# Patient Record
Sex: Female | Born: 2005 | Hispanic: Yes | Marital: Single | Smoking: Never smoker
Health system: Southern US, Community
[De-identification: ages and names within clinical notes are randomized; demographics above are authoritative.]

## PROBLEM LIST (undated history)

## (undated) DIAGNOSIS — S52502A Unspecified fracture of the lower end of left radius, initial encounter for closed fracture: Secondary | ICD-10-CM

## (undated) DIAGNOSIS — K5900 Constipation, unspecified: Secondary | ICD-10-CM

## (undated) HISTORY — PX: WISDOM TOOTH EXTRACTION: SHX21

## (undated) HISTORY — DX: Unspecified fracture of the lower end of left radius, initial encounter for closed fracture: S52.502A

## (undated) HISTORY — DX: Constipation, unspecified: K59.00

---
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
## 2023-03-09 DIAGNOSIS — H5213 Myopia, bilateral: Secondary | ICD-10-CM | POA: Diagnosis not present

## 2023-04-26 DIAGNOSIS — H5213 Myopia, bilateral: Secondary | ICD-10-CM | POA: Diagnosis not present

## 2023-09-12 ENCOUNTER — Ambulatory Visit (HOSPITAL_COMMUNITY)
Admission: EM | Admit: 2023-09-12 | Discharge: 2023-09-12 | Disposition: A | Discharge status: Home / Self Care | Attending: Family Medicine | Admitting: Family Medicine

## 2023-09-12 ENCOUNTER — Other Ambulatory Visit: Payer: Self-pay

## 2023-09-12 ENCOUNTER — Encounter (HOSPITAL_COMMUNITY): Payer: Self-pay | Admitting: Emergency Medicine

## 2023-09-12 DIAGNOSIS — Z202 Contact with and (suspected) exposure to infections with a predominantly sexual mode of transmission: Secondary | ICD-10-CM | POA: Insufficient documentation

## 2023-09-12 MED ORDER — DOXYCYCLINE HYCLATE 100 MG PO CAPS: 100.0000 mg | ORAL_CAPSULE | Freq: Two times a day (BID) | ORAL | 0 refills | Status: AC

## 2023-09-12 NOTE — Discharge Instructions (Addendum)
 We have sent testing for sexually transmitted infections. We will notify you of any positive results once they are received. If required, we will prescribe any medications you might need.  Please refrain from all sexual activity for at least the next seven days.

## 2023-09-12 NOTE — ED Provider Notes (Signed)
  [facility]   [id number] 09/12/23 Arrival Time: 1916  ASSESSMENT & PLAN:  1. Possible exposure to STD    Will tx empirically for chlamydia. Meds ordered this encounter  Medications   doxycycline (VIBRAMYCIN) 100 MG capsule    Sig: Take 1 capsule (100 mg total) by mouth 2 (two) times daily.    Dispense:  14 capsule    Refill:  0      Discharge Instructions      We have sent testing for sexually transmitted infections. We will notify you of any positive results once they are received. If required, we will prescribe any medications you might need.  Please refrain from all sexual activity for at least the next seven days.    Without s/s of PID.  Labs Reviewed  CERVICOVAGINAL ANCILLARY ONLY    Will notify of any positive results. Instructed to refrain from sexual activity for at least seven days.  Reviewed expectations re: course of current medical issues. Questions answered. Outlined signs and symptoms indicating need for more acute intervention. Patient verbalized understanding. After Visit Summary given.   SUBJECTIVE:  Joyce Haas is a 18 y.o. female who reports possible chlamydia exposure; recent partner tested + and informed her. She denies any symptoms.  Patient's last menstrual period was 09/08/2023.   OBJECTIVE:  Vitals:   09/12/23 1945  BP: 126/68  Pulse: 77  Resp: 18  Temp: 97.8 F (36.6 C)  TempSrc: Oral  SpO2: 100%    Gen: NAD GU: deferred Skin: warm and dry Psychological: alert and cooperative; normal mood and affect.    Labs Reviewed  CERVICOVAGINAL ANCILLARY ONLY    No Known Allergies  Past Medical History:  Diagnosis Date   Constipation    Fracture of left distal radius 07/29/2014   Followed by Dr. Aloha Arnold    Family History  Problem Relation Age of Onset   GER disease Father    Hypertension Father    Diabetes Maternal Grandmother    Hypertension Maternal Grandfather    Diabetes Paternal Grandmother     Hypertension Paternal Grandmother    Hyperlipidemia Paternal Grandmother    Diabetes Paternal Grandfather    Social History   Socioeconomic History   Marital status: Single    Spouse name: Not on file   Number of children: Not on file   Years of education: Not on file   Highest education level: Not on file  Occupational History   Not on file  Tobacco Use   Smoking status: Never   Smokeless tobacco: Never  Vaping Use   Vaping status: Some Days  Substance and Sexual Activity   Alcohol use: Never   Drug use: Never   Sexual activity: Not Currently  Other Topics Concern   Not on file  Social History Narrative   Not on file   Social Drivers of Health   Financial Resource Strain: Not on file  Food Insecurity: Not on file  Transportation Needs: Not on file  Physical Activity: Not on file  Stress: Not on file  Social Connections: Not on file  Intimate Partner Violence: Not on file           Afton Albright, MD 09/12/23 [phone]

## 2023-09-12 NOTE — ED Triage Notes (Signed)
 Denies vaginal discharge, burning or itching.  Reports partner has been diagnosed with chlamydia

## 2023-09-13 ENCOUNTER — Ambulatory Visit (HOSPITAL_COMMUNITY): Payer: Self-pay

## 2023-09-13 LAB — CERVICOVAGINAL ANCILLARY ONLY
Bacterial Vaginitis (gardnerella): POSITIVE — AB
Candida Glabrata: NEGATIVE
Candida Vaginitis: NEGATIVE
Chlamydia: POSITIVE — AB
Comment: NEGATIVE
Comment: NEGATIVE
Comment: NEGATIVE
Comment: NEGATIVE
Comment: NEGATIVE
Comment: NORMAL
Neisseria Gonorrhea: NEGATIVE
Trichomonas: NEGATIVE

## 2023-09-26 ENCOUNTER — Ambulatory Visit (HOSPITAL_COMMUNITY)

## 2023-09-26 ENCOUNTER — Ambulatory Visit (HOSPITAL_COMMUNITY)
Admission: RE | Admit: 2023-09-26 | Discharge: 2023-09-26 | Disposition: A | Discharge status: Home / Self Care | Source: Ambulatory Visit | Attending: Pediatrics | Admitting: Pediatrics

## 2023-09-26 ENCOUNTER — Encounter (HOSPITAL_COMMUNITY): Payer: Self-pay

## 2023-09-26 VITALS — BP 137/87 | HR 83 | Temp 98.1°F | Resp 16

## 2023-09-26 DIAGNOSIS — Z09 Encounter for follow-up examination after completed treatment for conditions other than malignant neoplasm: Secondary | ICD-10-CM

## 2023-09-26 NOTE — Discharge Instructions (Addendum)
 Too early for retesting today. Should wait 4 weeks from end of treatment or if symptoms occur. Should also return in 3-4 months for retesting.   Return as needed to urgent care.

## 2023-09-26 NOTE — ED Provider Notes (Signed)
 [facility]    CSN: [id number] Arrival date & time: 09/26/23  1641      History   Chief Complaint Chief Complaint  Patient presents with   SEXUALLY TRANSMITTED DISEASE    HPI Joyce Haas is a 18 y.o. female.   18 y.o. female who presents to urgent care wanting retesting for cure of chlamydia. She finished her antibiotics last week. She reports no symptoms. Her partner was under treatment as well. Denies vaginal discharge, pain, dysuria.      Past Medical History:  Diagnosis Date   Constipation    Fracture of left distal radius 07/29/2014   Followed by Dr. Camella     Patient Active Problem List   Diagnosis Date Noted   Acanthosis nigricans 10/15/2013   Allergic rhinitis 07/05/2013   Obesity, unspecified 07/05/2013    Past Surgical History:  Procedure Laterality Date   TONSILLECTOMY AND ADENOIDECTOMY  10/02/2013   Dr. Karis   WISDOM TOOTH EXTRACTION      OB History   No obstetric history on file.      Home Medications    Prior to Admission medications   Medication Sig Start Date End Date Taking? Authorizing Provider  doxycycline  (VIBRAMYCIN ) 100 MG capsule Take 1 capsule (100 mg total) by mouth 2 (two) times daily. 09/12/23   Rolinda Rogue, MD    Family History Family History  Problem Relation Age of Onset   GER disease Father    Hypertension Father    Diabetes Maternal Grandmother    Hypertension Maternal Grandfather    Diabetes Paternal Grandmother    Hypertension Paternal Grandmother    Hyperlipidemia Paternal Grandmother    Diabetes Paternal Grandfather     Social History Social History   Tobacco Use   Smoking status: Never   Smokeless tobacco: Never  Vaping Use   Vaping status: Some Days  Substance Use Topics   Alcohol use: Never   Drug use: Never     Allergies   Patient has no known allergies.   Review of Systems Review of Systems  Constitutional:  Negative for chills and fever.  HENT:  Negative for ear  pain and sore throat.   Eyes:  Negative for pain and visual disturbance.  Respiratory:  Negative for cough and shortness of breath.   Cardiovascular:  Negative for chest pain and palpitations.  Gastrointestinal:  Negative for abdominal pain and vomiting.  Genitourinary:  Negative for dysuria and hematuria.  Musculoskeletal:  Negative for arthralgias and back pain.  Skin:  Negative for color change and rash.  Neurological:  Negative for seizures and syncope.  All other systems reviewed and are negative.    Physical Exam Triage Vital Signs ED Triage Vitals  Encounter Vitals Group     BP 09/26/23 1654 137/87     Girls Systolic BP Percentile --      Girls Diastolic BP Percentile --      Boys Systolic BP Percentile --      Boys Diastolic BP Percentile --      Pulse Rate 09/26/23 1654 83     Resp 09/26/23 1654 16     Temp 09/26/23 1654 98.1 F (36.7 C)     Temp Source 09/26/23 1654 Oral     SpO2 09/26/23 1654 98 %     Weight --      Height --      Head Circumference --      Peak Flow --      Pain Score  09/26/23 1653 0     Pain Loc --      Pain Education --      Exclude from Growth Chart --    No data found.  Updated Vital Signs BP 137/87 (BP Location: Left Arm)   Pulse 83   Temp 98.1 F (36.7 C) (Oral)   Resp 16   LMP 09/08/2023 (Exact Date)   SpO2 98%   Visual Acuity Right Eye Distance:   Left Eye Distance:   Bilateral Distance:    Right Eye Near:   Left Eye Near:    Bilateral Near:     Physical Exam Vitals and nursing note reviewed.  Constitutional:      General: She is not in acute distress.    Appearance: She is well-developed.  HENT:     Head: Normocephalic and atraumatic.     Mouth/Throat:     Mouth: Mucous membranes are moist.   Eyes:     Conjunctiva/sclera: Conjunctivae normal.    Cardiovascular:     Rate and Rhythm: Normal rate and regular rhythm.     Heart sounds: No murmur heard. Pulmonary:     Effort: Pulmonary effort is normal. No  respiratory distress.     Breath sounds: Normal breath sounds.  Abdominal:     Palpations: Abdomen is soft.   Musculoskeletal:        General: No swelling.     Cervical back: Neck supple.   Skin:    General: Skin is warm and dry.     Capillary Refill: Capillary refill takes less than 2 seconds.   Neurological:     Mental Status: She is alert.   Psychiatric:        Mood and Affect: Mood normal.      UC Treatments / Results  Labs (all labs ordered are listed, but only abnormal results are displayed) Labs Reviewed - No data to display  EKG   Radiology No results found.  Procedures Procedures (including critical care time)  Medications Ordered in UC Medications - No data to display  Initial Impression / Assessment and Plan / UC Course  I have reviewed the triage vital signs and the nursing notes.  Pertinent labs & imaging results that were available during my care of the patient were reviewed by me and considered in my medical decision making (see chart for details).     Follow-up exam after treatment   Advised patient that retesting is not recommended until at least 4 weeks post-treatment. She is not having symptoms. Also advised, she should be retested in 3-4 months. Recommend return if symptoms occur.  Final Clinical Impressions(s) / UC Diagnoses   Final diagnoses:  Follow-up exam after treatment     Discharge Instructions      Too early for retesting today. Should wait 4 weeks from end of treatment or if symptoms occur. Should also return in 3-4 months for retesting.   Return as needed to urgent care.     ED Prescriptions   None    PDMP not reviewed this encounter.   Teresa Almarie LABOR, PA-C 09/26/23 1717

## 2023-09-26 NOTE — ED Triage Notes (Signed)
 Pt states she would like STI checked again she took all meds for chlamydia but only waited 6 days. She denies any sx.

## 2023-11-16 IMAGING — CT CT HEAD W/O CM
4 series · 16 of 47 positions shown, 18 images · non-contrast
Comparison: None.

CLINICAL DATA: headache 1 month without prior headache history



[Series 2: head wo · axial · 0.45mm/px · z∈[+1042,+1162]mm · 7 of 33 slices shown, 9 images]
[im 5/33  brain]
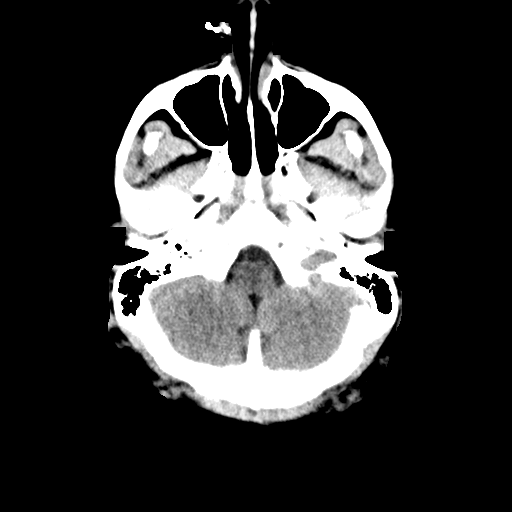
[im 5/33  bone]
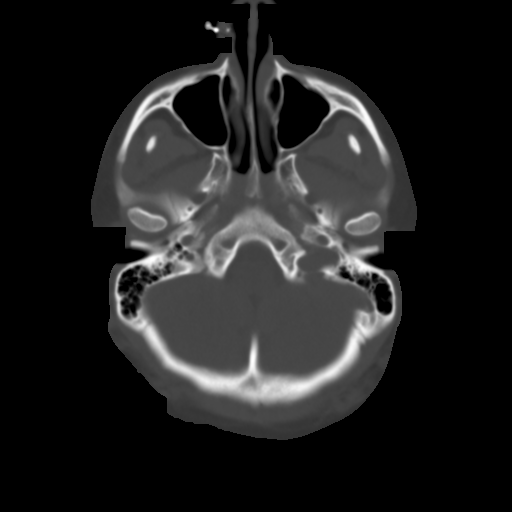
[im 9/33  brain]
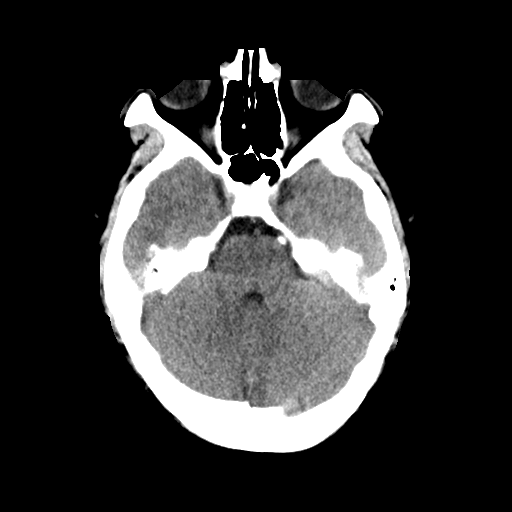
[im 13/33  brain]
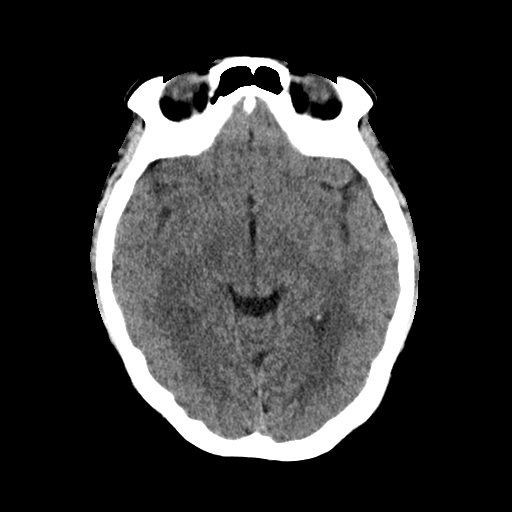
[im 17/33  brain]
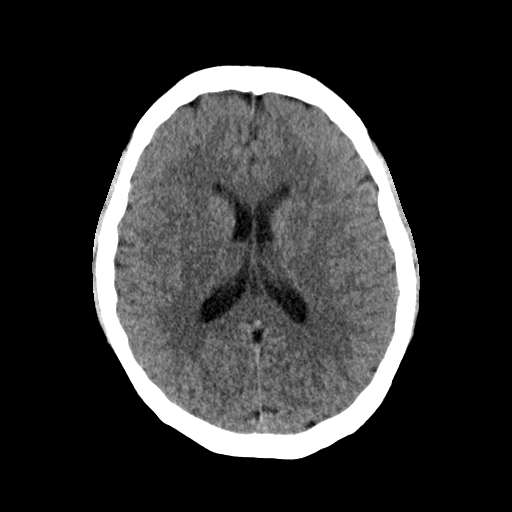
[im 21/33  brain]
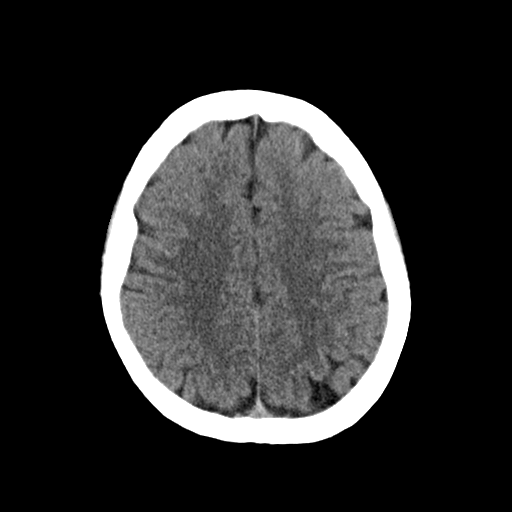
[im 21/33  bone]
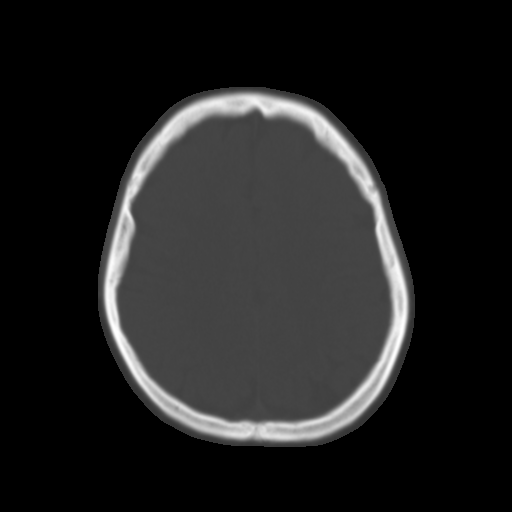
[im 25/33  brain]
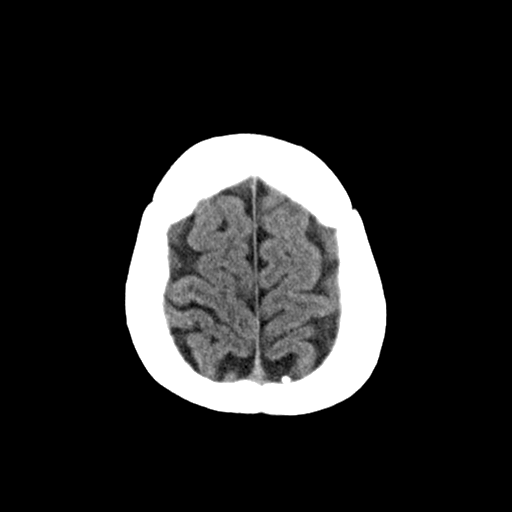
[im 29/33  brain]
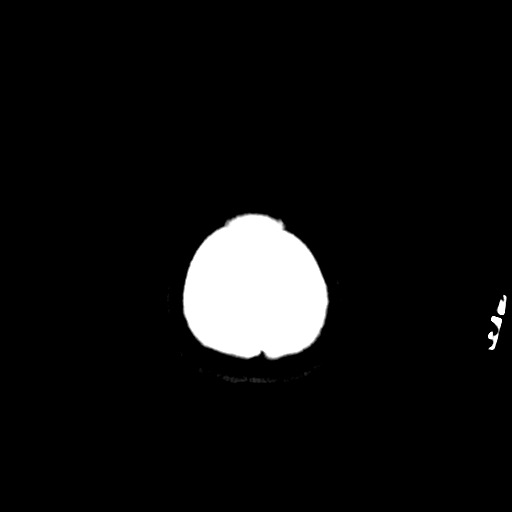

[Series 3: head bone · axial · 0.45mm/px · z∈[+1038,+1070]mm · 3 of 81 slices shown]
[im 9/81  bone]
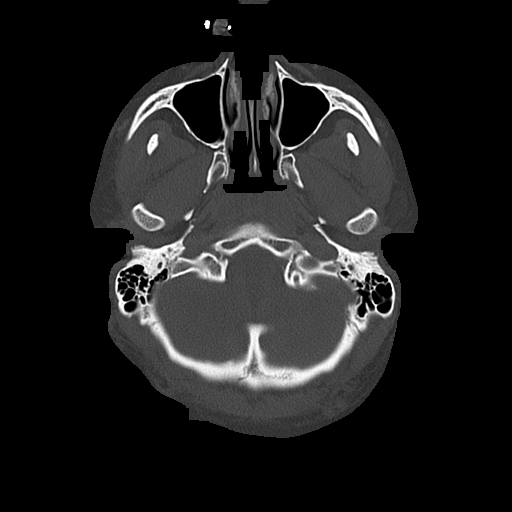
[im 17/81  bone]
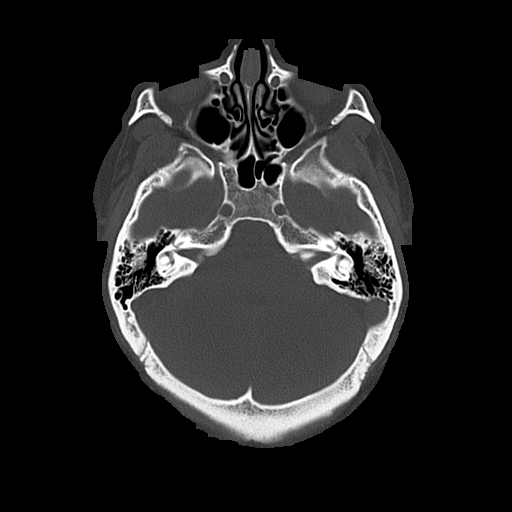
[im 25/81  bone]
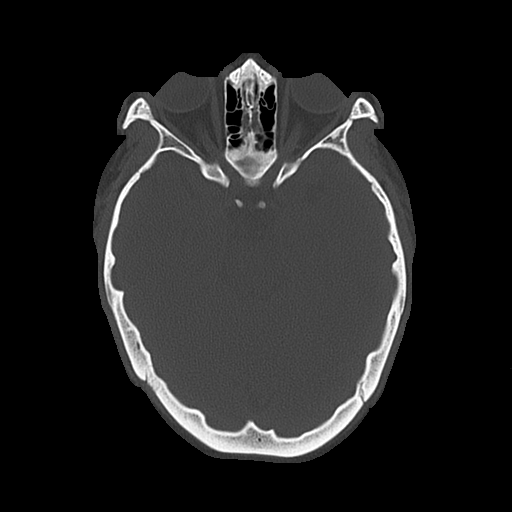

[Series 4: coronal soft · coronal · 0.33mm/px · 3 of 71 slices shown]
[im 24/71  brain]
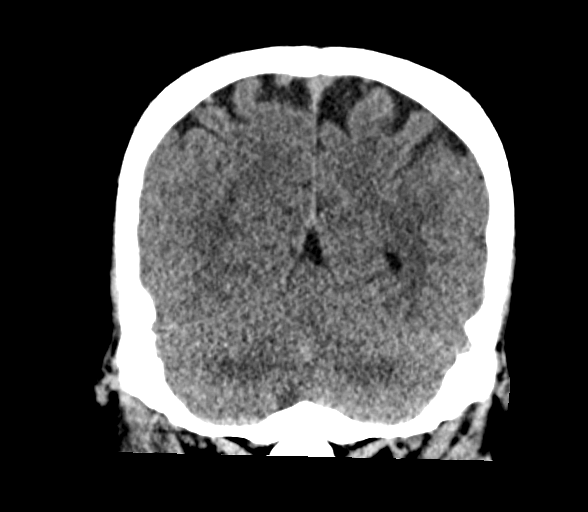
[im 32/71  brain]
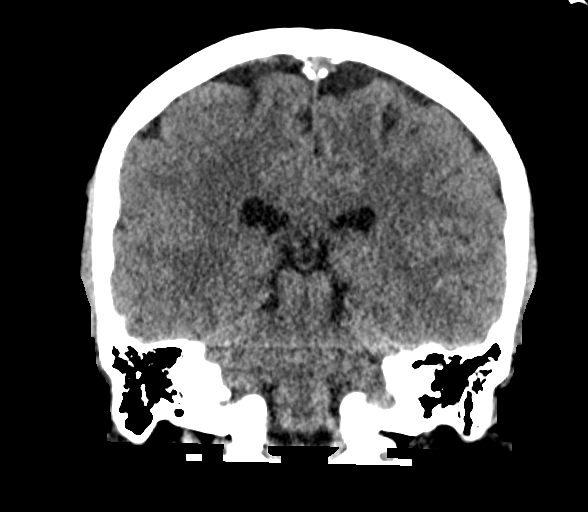
[im 39/71  brain]
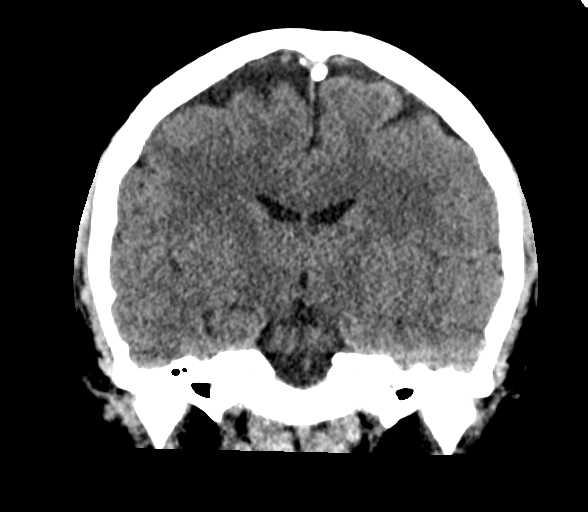

[Series 5: sagittal soft · sagittal · 0.34mm/px · 3 of 62 slices shown]
[im 21/62  brain]
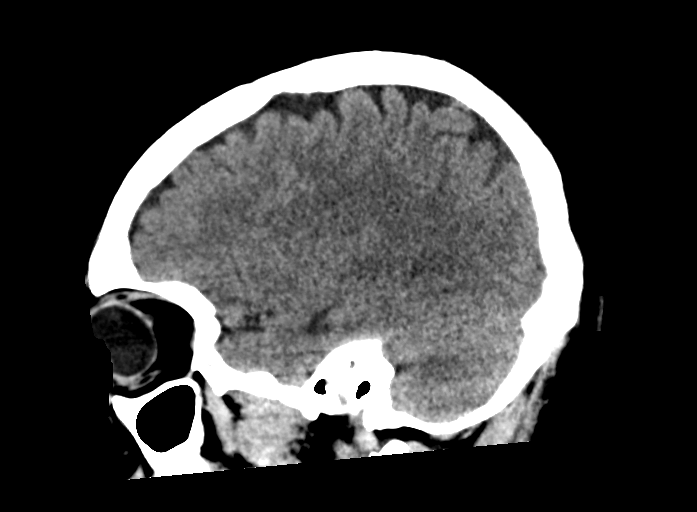
[im 31/62  brain]
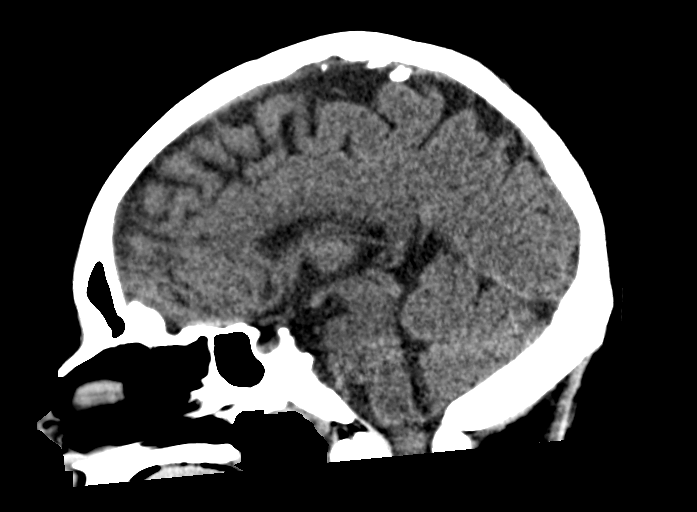
[im 41/62  brain]
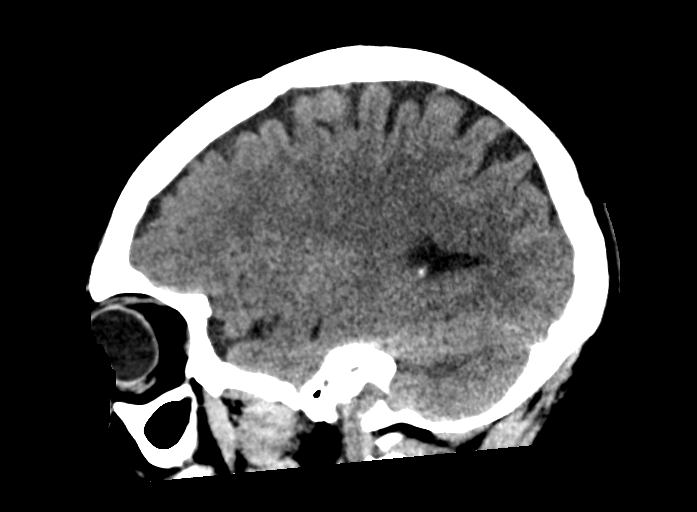

[16 of 47 positions shown; findings below may reference images not displayed]

FINDINGS: Brain: No acute intracranial abnormality. Specifically, no
hemorrhage, hydrocephalus, mass lesion, acute infarction, or
significant intracranial injury.

Vascular: No hyperdense vessel or unexpected calcification.

Skull: No acute calvarial abnormality.

Sinuses/Orbits: No acute findings

Other: None
IMPRESSION: Normal study.
